# Patient Record
Sex: Male | Born: 2000 | Race: Black or African American | Hispanic: No | Marital: Single | Smoking: Never smoker
Health system: Southern US, Community
[De-identification: ages and names within clinical notes are randomized; demographics above are authoritative.]

---
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
## 2019-04-19 ENCOUNTER — Encounter (HOSPITAL_COMMUNITY): Payer: Self-pay

## 2019-04-19 ENCOUNTER — Emergency Department (HOSPITAL_COMMUNITY)
Admission: EM | Admit: 2019-04-19 | Discharge: 2019-04-19 | Disposition: A | Discharge status: Home / Self Care | Payer: Self-pay | Attending: Emergency Medicine | Admitting: Emergency Medicine

## 2019-04-19 ENCOUNTER — Other Ambulatory Visit: Payer: Self-pay

## 2019-04-19 ENCOUNTER — Emergency Department (HOSPITAL_COMMUNITY): Payer: Self-pay

## 2019-04-19 DIAGNOSIS — S92531A Displaced fracture of distal phalanx of right lesser toe(s), initial encounter for closed fracture: Secondary | ICD-10-CM

## 2019-04-19 DIAGNOSIS — Y999 Unspecified external cause status: Secondary | ICD-10-CM | POA: Insufficient documentation

## 2019-04-19 DIAGNOSIS — S92513A Displaced fracture of proximal phalanx of unspecified lesser toe(s), initial encounter for closed fracture: Secondary | ICD-10-CM | POA: Insufficient documentation

## 2019-04-19 DIAGNOSIS — Y939 Activity, unspecified: Secondary | ICD-10-CM | POA: Insufficient documentation

## 2019-04-19 DIAGNOSIS — W208XXA Other cause of strike by thrown, projected or falling object, initial encounter: Secondary | ICD-10-CM | POA: Insufficient documentation

## 2019-04-19 DIAGNOSIS — Y929 Unspecified place or not applicable: Secondary | ICD-10-CM | POA: Insufficient documentation

## 2019-04-19 MED ORDER — HYDROCODONE-ACETAMINOPHEN 5-325 MG PO TABS: 2.0000 | ORAL_TABLET | Freq: Four times a day (QID) | ORAL | 0 refills | Status: AC | PRN

## 2019-04-19 NOTE — ED Triage Notes (Signed)
Pt presents w/broken toe. States he dropped a desk on his 2nd toe of his right foot

## 2019-04-19 NOTE — Discharge Instructions (Signed)
Old make an appointment with orthopedist for follow-up.  You have been prescribed pain medication.  Please take as prescribed this medication may make you drowsy do not use of alcohol.  She is not to use this medication you may use Tylenol ibuprofen as needed for pain.  If you choose to take the hydrocodone please use ibuprofen as there is Tylenol included in the hydrocodone.

## 2019-04-19 NOTE — ED Provider Notes (Signed)
[facility] EMERGENCY DEPARTMENT Provider Note   CSN: [id number] Arrival date & time: 04/19/19  1934     History   Chief Complaint Chief Complaint  Patient presents with  . Foot Pain    HPI Jacson Rapaport is a 18 y.o. male with no pertinent past medical history     HPI  Presents today for right second toe pain has been constant, sharp, achy, 5/10 since 4 PM today when he dropped a shelf on the toe.  Patient denies any bleeding, laceration, numbness.  States that he has difficulty walking secondary to the pain.  Denies any history of diabetes, immunocompromise.    History reviewed. No pertinent past medical history.  There are no active problems to display for this patient.   History reviewed. No pertinent surgical history.      Home Medications    Prior to Admission medications   Medication Sig Start Date End Date Taking? Authorizing Provider  ibuprofen (ADVIL,MOTRIN) 600 MG tablet Take 1 tablet (600 mg total) by mouth every 6 (six) hours as needed. 08/17/16   Horton, Barbette Hair, MD    Family History History reviewed. No pertinent family history.  Social History Social History   Tobacco Use  . Smoking status: Never Smoker  . Smokeless tobacco: Never Used  Substance Use Topics  . Alcohol use: No  . Drug use: No     Allergies   Patient has no known allergies.   Review of Systems Review of Systems  Constitutional: Negative for fever.  Respiratory: Negative for cough.   Cardiovascular: Negative for chest pain.  Musculoskeletal:       Right second toe pain  Neurological: Negative for dizziness.     Physical Exam Updated Vital Signs BP (!) 136/95 (BP Location: Right Arm)   Pulse 81   Temp 98.7 F (37.1 C) (Oral)   Resp 18   SpO2 98%   Physical Exam Vitals signs and nursing note reviewed.  Constitutional:      General: He is not in acute distress.    Appearance: Normal appearance. He is not ill-appearing.  HENT:   Head: Normocephalic and atraumatic.  Eyes:     General: No scleral icterus.       Right eye: No discharge.        Left eye: No discharge.     Conjunctiva/sclera: Conjunctivae normal.  Pulmonary:     Effort: Pulmonary effort is normal.     Breath sounds: No stridor.  Musculoskeletal:     Comments: Patient with severe tenderness to palpation of the right second toe at the distal phalanx  Patient is able to wiggle all toes and flex and extend the second toe lightly against resistance, weakness appears to be secondary to pain  Neurological:     Mental Status: He is alert and oriented to person, place, and time. Mental status is at baseline.     Comments: Sensation intact right second toe      ED Treatments / Results  Labs (all labs ordered are listed, but only abnormal results are displayed) Labs Reviewed - No data to display  EKG None  Radiology Dg Foot Complete Right  Result Date: 04/19/2019 CLINICAL DATA:  Right foot pain after injury. Dropped a dresser on foot. EXAM: RIGHT FOOT COMPLETE - 3+ VIEW COMPARISON:  None. FINDINGS: Displaced fracture of the second toe distal phalanx. No intra-articular extension. No other fracture of the foot. The alignment and joint spaces are maintained. Soft tissue edema  noted of the second digit adjacent to fracture site. IMPRESSION: Displaced fracture of the second toe distal phalanx. No intra-articular extension. Electronically Signed   By: Narda Rutherford M.D.   On: 04/19/2019 20:28    Procedures Procedures (including critical care time)  Medications Ordered in ED Medications - No data to display   Initial Impression / Assessment and Plan / ED Course  I have reviewed the triage vital signs and the nursing notes.  Pertinent labs & imaging results that were available during my care of the patient were reviewed by me and considered in my medical decision making (see chart for details).        Patient presents for right second toe pain  x-ray shows displaced fracture of the second toe distal phalanx.  Patient has good cap refill, sensation, is able to flex and extend toe weakly likely secondary to pain.  My personal review the x-ray of the displaced fracture is only mildly displaced.  Patient has reassuring physical exam.  No laceration, no bleeding.  No need for antibiotics or orthopedic consultation at this time.  will discharge patient with buddy tape and postop shoe.  Will provide patient with several day supply of hydrocodone pills for pain management and recommend Tylenol/ibuprofen as alternative..  Patient will follow up with orthopedics.  Final Clinical Impressions(s) / ED Diagnoses   Final diagnoses:  Closed displaced fracture of distal phalanx of lesser toe of right foot, initial encounter    ED Discharge Orders    None       Gailen Shelter, [place] 04/19/19 2249    Alvira Monday, MD 04/20/19 1238

## 2019-04-19 NOTE — ED Notes (Signed)
Discharge instructions discussed with pt. Pt verbalized understanding. Pt stable and ambulatory. No signature pad available. 

## 2020-06-12 IMAGING — CR DG FOOT COMPLETE 3+V*R*
3 series · 3 of 3 positions shown · non-contrast
Comparison: None.

CLINICAL DATA: Right foot pain after injury. Dropped a dresser on
foot.

EXAM:
RIGHT FOOT COMPLETE - 3+ VIEW

[foot ap]
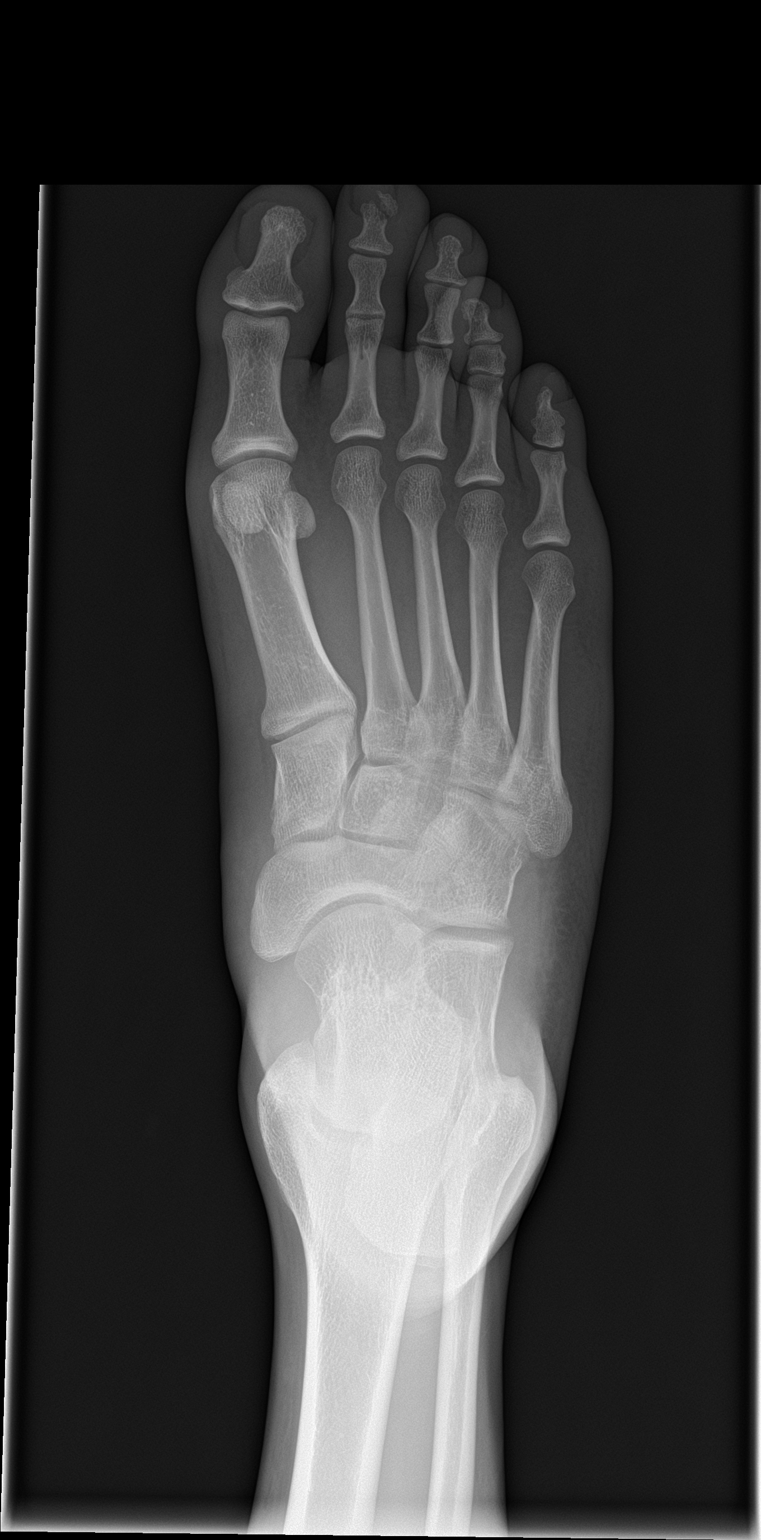

[foot obl]
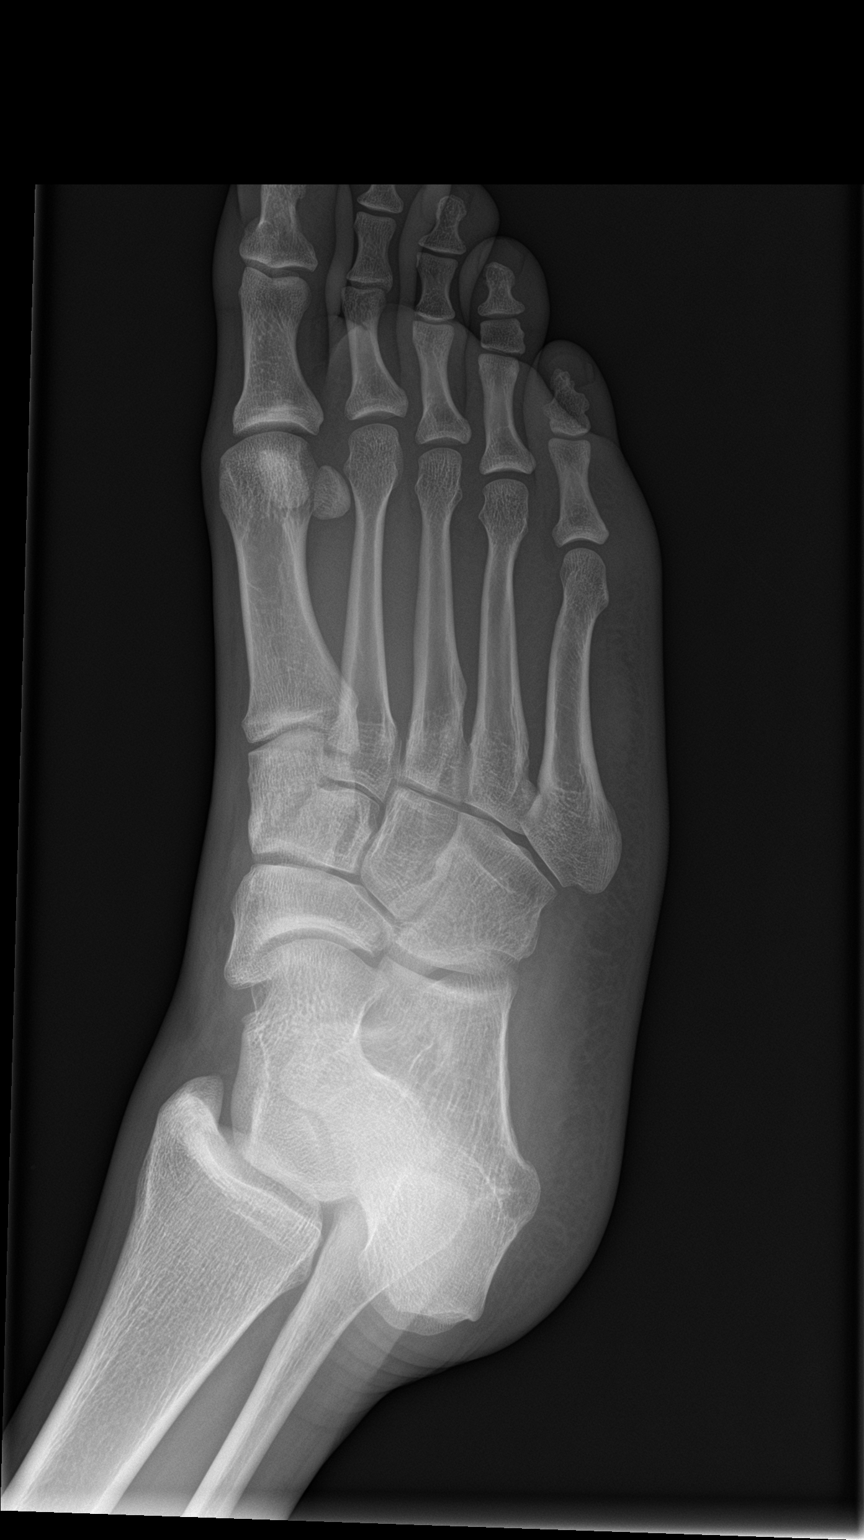

[foot lat]
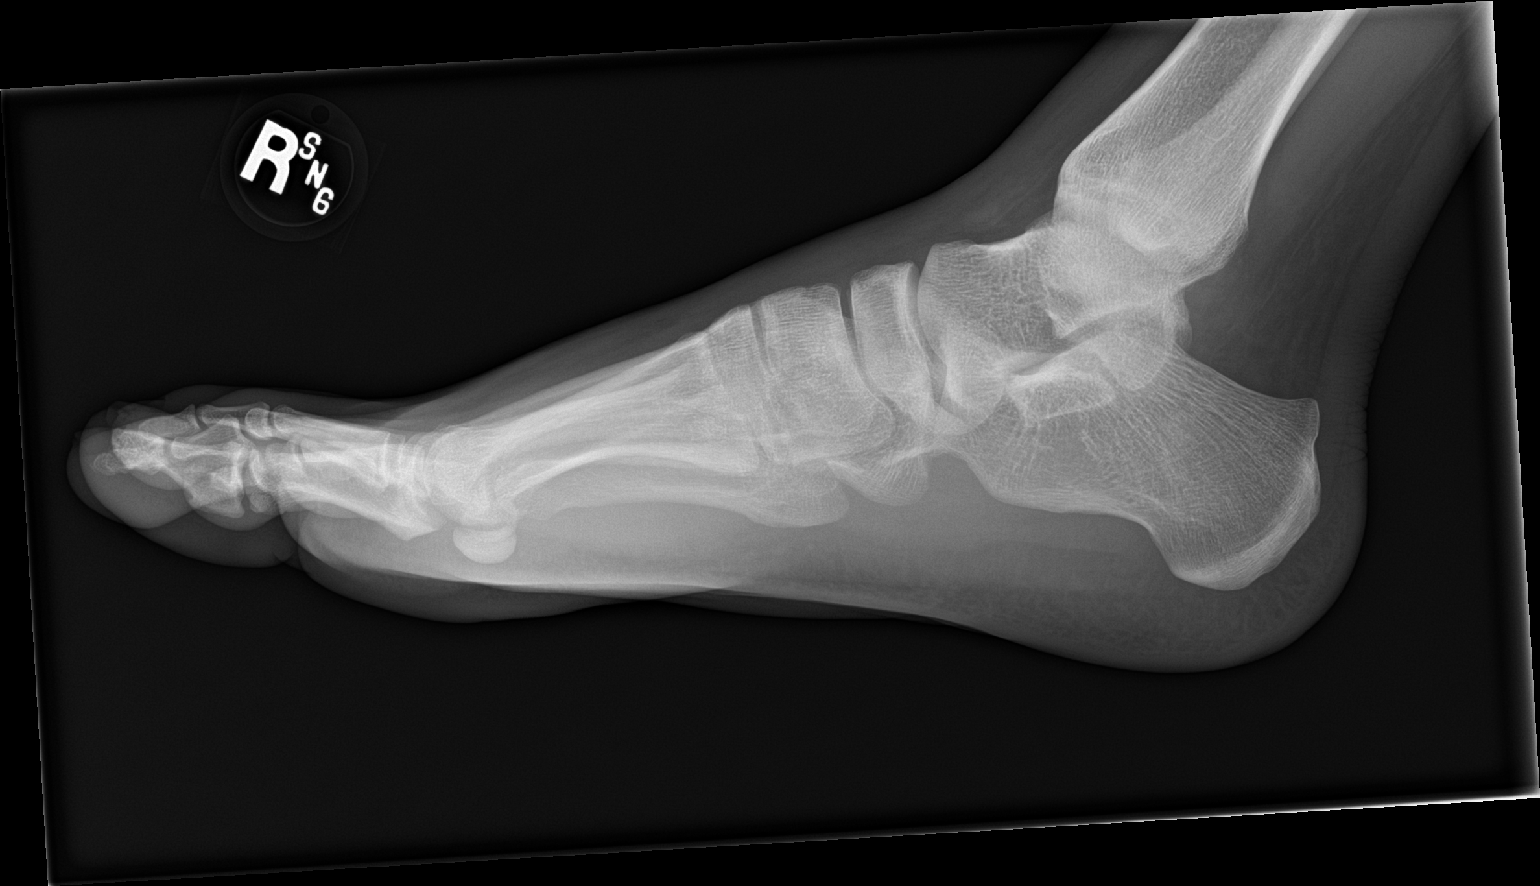

[3 of 3 positions shown; findings below may reference images not displayed]

FINDINGS: Displaced fracture of the second toe distal phalanx. No
intra-articular extension. No other fracture of the foot. The
alignment and joint spaces are maintained. Soft tissue edema noted
of the second digit adjacent to fracture site.
IMPRESSION: Displaced fracture of the second toe distal phalanx. No
intra-articular extension.

## 2020-10-30 ENCOUNTER — Ambulatory Visit (INDEPENDENT_AMBULATORY_CARE_PROVIDER_SITE_OTHER): Payer: Self-pay | Admitting: Plastic Surgery

## 2020-10-30 ENCOUNTER — Encounter: Payer: Self-pay | Admitting: Plastic Surgery

## 2020-10-30 ENCOUNTER — Other Ambulatory Visit: Payer: Self-pay

## 2020-10-30 VITALS — BP 116/76 | HR 74 | Ht 73.0 in | Wt 143.0 lb

## 2020-10-30 DIAGNOSIS — Z411 Encounter for cosmetic surgery: Secondary | ICD-10-CM

## 2020-10-30 NOTE — Progress Notes (Signed)
   Referring Provider No referring provider defined for this encounter.   CC:  Chief Complaint  Patient presents with  . Advice Only      Alec Skinner is an 20 y.o. male.  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  Its been present for over a year.  He is from an ear piercing.  He would like to have it removed.  It has not previously been treated.  No Known Allergies  Outpatient Encounter Medications as of 10/30/2020  Medication Sig  . ibuprofen (ADVIL,MOTRIN) 600 MG tablet Take 1 tablet (600 mg total) by mouth every 6 (six) hours as needed.   No facility-administered encounter medications on file as of 10/30/2020.     No past medical history on file.  No past surgical history on file.  No family history on file.  Social History   Social History Narrative  . Not on file     Review of Systems General: Denies fevers, chills, weight loss CV: Denies chest pain, shortness of breath, palpitations  Physical Exam Vitals with BMI 10/30/2020 04/19/2019 04/19/2019  Height 6\' 1"  - -  Weight 143 lbs - -  BMI 18.87 - -  Systolic 116 141 -  Diastolic 76 91 -  Pulse 74 67 72    General:  No acute distress,  Alert and oriented, Non-Toxic, Normal speech and affect Examination shows a large anterior and posterior keloid on his right earlobe.  In total its over 4 cm in size.  Its attachment to the earlobe is fairly broad in the anterior and posterior curable keloids nearly meet at the periphery of the earlobe.  Assessment/Plan I recommended wedge excision of that portion of his earlobe.  I think that would be the best way to get it removed and closed without distorting the earlobe or requiring more complex measures.  I explained this in detail to the patient.  We discussed the risks include bleeding, infection, damage to surrounding structures and need for additional procedures.  All of his questions were answered we will plan to move forward.  10/30/2020, 5:16 PM

## 2020-12-18 ENCOUNTER — Other Ambulatory Visit: Payer: Self-pay

## 2020-12-18 ENCOUNTER — Ambulatory Visit (INDEPENDENT_AMBULATORY_CARE_PROVIDER_SITE_OTHER): Payer: Self-pay | Admitting: Plastic Surgery

## 2020-12-18 ENCOUNTER — Encounter: Payer: Self-pay | Admitting: Plastic Surgery

## 2020-12-18 VITALS — BP 107/72 | HR 81

## 2020-12-18 DIAGNOSIS — Z411 Encounter for cosmetic surgery: Secondary | ICD-10-CM

## 2020-12-18 NOTE — Progress Notes (Signed)
Operative Note   DATE OF OPERATION: 12/18/2020  LOCATION:    SURGICAL DEPARTMENT: Plastic Surgery  PREOPERATIVE DIAGNOSES: Right ear keloid  POSTOPERATIVE DIAGNOSES:  same  PROCEDURE:  Excision of right ear keloid measuring 3 cm Complex closure measuring 3 cm  SURGEON: Ancil Linsey, MD  ANESTHESIA:  Local  COMPLICATIONS: None.   INDICATIONS FOR PROCEDURE:  The patient, Alec Skinner is a 20 y.o. male born on Oct 26, 2000, is here for treatment of large anterior and posterior keloid to the right earlobe MRN: [id number]  CONSENT:  Informed consent was obtained directly from the patient. Risks, benefits and alternatives were fully discussed. Specific risks including but not limited to bleeding, infection, hematoma, seroma, scarring, pain, infection, wound healing problems, and need for further surgery were all discussed. The patient did have an ample opportunity to have questions answered to satisfaction.   DESCRIPTION OF PROCEDURE:  Local anesthesia was administered. The patient's operative site was prepped and draped in a sterile fashion. A time out was performed and all information was confirmed to be correct.  A wedge excision was performed of the earlobe encompassing the anterior and posterior keloids..  Hemostasis was obtained.  Earlobe was then repaired with 5-0 Monocryl sutures.    The patient tolerated the procedure well.  There were no complications.

## 2021-01-01 ENCOUNTER — Ambulatory Visit: Payer: Self-pay | Admitting: Plastic Surgery
